# Patient Record
Sex: Male | Born: 1985 | Hispanic: No | Marital: Single | Smoking: Never smoker
Health system: Southern US, Community
[De-identification: ages and names within clinical notes are randomized; demographics above are authoritative.]

## PROBLEM LIST (undated history)

## (undated) DIAGNOSIS — N289 Disorder of kidney and ureter, unspecified: Secondary | ICD-10-CM

## (undated) HISTORY — PX: TESTICLE REMOVAL: SHX68

---
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
## 2004-05-22 ENCOUNTER — Ambulatory Visit (HOSPITAL_COMMUNITY): Admission: RE | Admit: 2004-05-22 | Discharge: 2004-05-22 | Payer: Self-pay | Admitting: Emergency Medicine

## 2017-02-17 ENCOUNTER — Inpatient Hospital Stay (HOSPITAL_COMMUNITY)
Admission: EM | Admit: 2017-02-17 | Discharge: 2017-02-20 | DRG: 872 | Disposition: A | Discharge status: Home / Self Care | Payer: Self-pay | Attending: Family Medicine | Admitting: Family Medicine

## 2017-02-17 ENCOUNTER — Encounter (HOSPITAL_COMMUNITY): Payer: Self-pay | Admitting: Emergency Medicine

## 2017-02-17 DIAGNOSIS — D72829 Elevated white blood cell count, unspecified: Secondary | ICD-10-CM | POA: Diagnosis present

## 2017-02-17 DIAGNOSIS — E872 Acidosis, unspecified: Secondary | ICD-10-CM | POA: Diagnosis present

## 2017-02-17 DIAGNOSIS — N390 Urinary tract infection, site not specified: Secondary | ICD-10-CM | POA: Diagnosis present

## 2017-02-17 DIAGNOSIS — D6489 Other specified anemias: Secondary | ICD-10-CM | POA: Diagnosis present

## 2017-02-17 DIAGNOSIS — R799 Abnormal finding of blood chemistry, unspecified: Secondary | ICD-10-CM | POA: Diagnosis present

## 2017-02-17 DIAGNOSIS — M6282 Rhabdomyolysis: Secondary | ICD-10-CM | POA: Diagnosis present

## 2017-02-17 DIAGNOSIS — A419 Sepsis, unspecified organism: Principal | ICD-10-CM | POA: Diagnosis present

## 2017-02-17 DIAGNOSIS — R778 Other specified abnormalities of plasma proteins: Secondary | ICD-10-CM | POA: Diagnosis present

## 2017-02-17 DIAGNOSIS — N3001 Acute cystitis with hematuria: Secondary | ICD-10-CM

## 2017-02-17 DIAGNOSIS — N200 Calculus of kidney: Secondary | ICD-10-CM | POA: Diagnosis present

## 2017-02-17 DIAGNOSIS — E860 Dehydration: Secondary | ICD-10-CM | POA: Diagnosis present

## 2017-02-17 DIAGNOSIS — N179 Acute kidney failure, unspecified: Secondary | ICD-10-CM | POA: Diagnosis present

## 2017-02-17 DIAGNOSIS — N3000 Acute cystitis without hematuria: Secondary | ICD-10-CM

## 2017-02-17 DIAGNOSIS — Z87442 Personal history of urinary calculi: Secondary | ICD-10-CM

## 2017-02-17 DIAGNOSIS — R7303 Prediabetes: Secondary | ICD-10-CM | POA: Diagnosis present

## 2017-02-17 DIAGNOSIS — R739 Hyperglycemia, unspecified: Secondary | ICD-10-CM | POA: Diagnosis present

## 2017-02-17 HISTORY — DX: Disorder of kidney and ureter, unspecified: N28.9

## 2017-02-17 LAB — URINALYSIS, ROUTINE W REFLEX MICROSCOPIC
Bilirubin Urine: NEGATIVE
GLUCOSE, UA: NEGATIVE mg/dL
KETONES UR: NEGATIVE mg/dL
Nitrite: NEGATIVE
PROTEIN: 100 mg/dL — AB
Specific Gravity, Urine: 1.015 (ref 1.005–1.030)
pH: 5 (ref 5.0–8.0)

## 2017-02-17 LAB — COMPREHENSIVE METABOLIC PANEL
ALT: 43 U/L (ref 17–63)
AST: 39 U/L (ref 15–41)
Albumin: 5.8 g/dL — ABNORMAL HIGH (ref 3.5–5.0)
Alkaline Phosphatase: 95 U/L (ref 38–126)
Anion gap: 19 — ABNORMAL HIGH (ref 5–15)
BUN: 20 mg/dL (ref 6–20)
CHLORIDE: 95 mmol/L — AB (ref 101–111)
CO2: 21 mmol/L — ABNORMAL LOW (ref 22–32)
CREATININE: 3.1 mg/dL — AB (ref 0.61–1.24)
Calcium: 11.2 mg/dL — ABNORMAL HIGH (ref 8.9–10.3)
GFR calc Af Amer: 29 mL/min — ABNORMAL LOW (ref >60)
GFR calc non Af Amer: 25 mL/min — ABNORMAL LOW (ref >60)
GLUCOSE: 219 mg/dL — AB (ref 65–99)
POTASSIUM: 3.9 mmol/L (ref 3.5–5.1)
SODIUM: 135 mmol/L (ref 135–145)
Total Bilirubin: 1.2 mg/dL (ref 0.3–1.2)
Total Protein: 10.6 g/dL — ABNORMAL HIGH (ref 6.5–8.1)

## 2017-02-17 LAB — LIPASE, BLOOD: LIPASE: 31 U/L (ref 11–51)

## 2017-02-17 LAB — CBC
HEMATOCRIT: 45.7 % (ref 39.0–52.0)
Hemoglobin: 15.8 g/dL (ref 13.0–17.0)
MCH: 28.8 pg (ref 26.0–34.0)
MCHC: 34.6 g/dL (ref 30.0–36.0)
MCV: 83.4 fL (ref 78.0–100.0)
PLATELETS: 422 10*3/uL — AB (ref 150–400)
RBC: 5.48 MIL/uL (ref 4.22–5.81)
RDW: 12.8 % (ref 11.5–15.5)
WBC: 16.9 10*3/uL — ABNORMAL HIGH (ref 4.0–10.5)

## 2017-02-17 LAB — CK: CK TOTAL: 211 U/L (ref 49–397)

## 2017-02-17 MED ORDER — OXYCODONE-ACETAMINOPHEN 5-325 MG PO TABS
ORAL_TABLET | ORAL | Status: AC
  Filled 2017-02-17: qty 1

## 2017-02-17 MED ORDER — SODIUM CHLORIDE 0.9 % IV BOLUS (SEPSIS)
1000.0000 mL | Freq: Once | INTRAVENOUS | Status: AC
  Administered 2017-02-18: 1000 mL via INTRAVENOUS

## 2017-02-17 MED ORDER — OXYCODONE-ACETAMINOPHEN 5-325 MG PO TABS
1.0000 | ORAL_TABLET | ORAL | Status: DC | PRN
  Administered 2017-02-17: 1 via ORAL

## 2017-02-17 NOTE — ED Triage Notes (Signed)
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 Family states pt vomited every time he tried to drink anything.

## 2017-02-18 ENCOUNTER — Emergency Department (HOSPITAL_COMMUNITY): Payer: Self-pay

## 2017-02-18 DIAGNOSIS — E872 Acidosis, unspecified: Secondary | ICD-10-CM | POA: Diagnosis present

## 2017-02-18 DIAGNOSIS — D72829 Elevated white blood cell count, unspecified: Secondary | ICD-10-CM | POA: Diagnosis present

## 2017-02-18 DIAGNOSIS — E860 Dehydration: Secondary | ICD-10-CM

## 2017-02-18 DIAGNOSIS — N179 Acute kidney failure, unspecified: Secondary | ICD-10-CM | POA: Diagnosis present

## 2017-02-18 DIAGNOSIS — R778 Other specified abnormalities of plasma proteins: Secondary | ICD-10-CM

## 2017-02-18 DIAGNOSIS — R739 Hyperglycemia, unspecified: Secondary | ICD-10-CM | POA: Diagnosis present

## 2017-02-18 DIAGNOSIS — N3000 Acute cystitis without hematuria: Secondary | ICD-10-CM

## 2017-02-18 DIAGNOSIS — N3001 Acute cystitis with hematuria: Secondary | ICD-10-CM

## 2017-02-18 DIAGNOSIS — N390 Urinary tract infection, site not specified: Secondary | ICD-10-CM | POA: Diagnosis present

## 2017-02-18 LAB — CBC
HCT: 35.4 % — ABNORMAL LOW (ref 39.0–52.0)
Hemoglobin: 12.4 g/dL — ABNORMAL LOW (ref 13.0–17.0)
MCH: 29.3 pg (ref 26.0–34.0)
MCHC: 35 g/dL (ref 30.0–36.0)
MCV: 83.7 fL (ref 78.0–100.0)
PLATELETS: 309 10*3/uL (ref 150–400)
RBC: 4.23 MIL/uL (ref 4.22–5.81)
RDW: 12.9 % (ref 11.5–15.5)
WBC: 10 10*3/uL (ref 4.0–10.5)

## 2017-02-18 LAB — BASIC METABOLIC PANEL
Anion gap: 8 (ref 5–15)
BUN: 22 mg/dL — AB (ref 6–20)
CALCIUM: 8.7 mg/dL — AB (ref 8.9–10.3)
CO2: 25 mmol/L (ref 22–32)
Chloride: 105 mmol/L (ref 101–111)
Creatinine, Ser: 1.85 mg/dL — ABNORMAL HIGH (ref 0.61–1.24)
GFR calc Af Amer: 54 mL/min — ABNORMAL LOW (ref >60)
GFR, EST NON AFRICAN AMERICAN: 46 mL/min — AB (ref >60)
Glucose, Bld: 121 mg/dL — ABNORMAL HIGH (ref 65–99)
POTASSIUM: 3.9 mmol/L (ref 3.5–5.1)
SODIUM: 138 mmol/L (ref 135–145)

## 2017-02-18 LAB — GLUCOSE, CAPILLARY
GLUCOSE-CAPILLARY: 107 mg/dL — AB (ref 65–99)
GLUCOSE-CAPILLARY: 112 mg/dL — AB (ref 65–99)
GLUCOSE-CAPILLARY: 116 mg/dL — AB (ref 65–99)
Glucose-Capillary: 85 mg/dL (ref 65–99)

## 2017-02-18 LAB — I-STAT CG4 LACTIC ACID, ED: Lactic Acid, Venous: 2.81 mmol/L (ref 0.5–1.9)

## 2017-02-18 LAB — MAGNESIUM: Magnesium: 2 mg/dL (ref 1.7–2.4)

## 2017-02-18 LAB — TSH: TSH: 0.529 u[IU]/mL (ref 0.350–4.500)

## 2017-02-18 MED ORDER — ACETAMINOPHEN 325 MG PO TABS: 650.0000 mg | ORAL_TABLET | Freq: Four times a day (QID) | ORAL | Status: DC | PRN

## 2017-02-18 MED ORDER — DEXTROSE 5 % IV SOLN
1.0000 g | Freq: Once | INTRAVENOUS | Status: AC
  Administered 2017-02-18: 1 g via INTRAVENOUS
  Filled 2017-02-18: qty 10

## 2017-02-18 MED ORDER — MORPHINE SULFATE (PF) 4 MG/ML IV SOLN
4.0000 mg | Freq: Once | INTRAVENOUS | Status: AC
  Administered 2017-02-18: 4 mg via INTRAVENOUS
  Filled 2017-02-18: qty 1

## 2017-02-18 MED ORDER — ONDANSETRON HCL 4 MG PO TABS: 4.0000 mg | ORAL_TABLET | Freq: Four times a day (QID) | ORAL | Status: DC | PRN

## 2017-02-18 MED ORDER — ONDANSETRON HCL 4 MG/2ML IJ SOLN
4.0000 mg | Freq: Once | INTRAMUSCULAR | Status: AC
  Administered 2017-02-18: 4 mg via INTRAVENOUS
  Filled 2017-02-18: qty 2

## 2017-02-18 MED ORDER — ACETAMINOPHEN 650 MG RE SUPP: 650.0000 mg | Freq: Four times a day (QID) | RECTAL | Status: DC | PRN

## 2017-02-18 MED ORDER — HEPARIN SODIUM (PORCINE) 5000 UNIT/ML IJ SOLN
5000.0000 [IU] | Freq: Three times a day (TID) | INTRAMUSCULAR | Status: DC
  Administered 2017-02-18 – 2017-02-20 (×7): 5000 [IU] via SUBCUTANEOUS
  Filled 2017-02-18 (×7): qty 1

## 2017-02-18 MED ORDER — INSULIN ASPART 100 UNIT/ML ~~LOC~~ SOLN: 0.0000 [IU] | Freq: Three times a day (TID) | SUBCUTANEOUS | Status: DC

## 2017-02-18 MED ORDER — INSULIN ASPART 100 UNIT/ML ~~LOC~~ SOLN: 0.0000 [IU] | Freq: Every day | SUBCUTANEOUS | Status: DC

## 2017-02-18 MED ORDER — DEXTROSE 5 % IV SOLN
1.0000 g | INTRAVENOUS | Status: DC
  Administered 2017-02-18: 1 g via INTRAVENOUS
  Filled 2017-02-18: qty 10

## 2017-02-18 MED ORDER — SODIUM CHLORIDE 0.9 % IV SOLN
INTRAVENOUS | Status: DC
  Administered 2017-02-18 – 2017-02-19 (×4): via INTRAVENOUS

## 2017-02-18 MED ORDER — ONDANSETRON HCL 4 MG/2ML IJ SOLN: 4.0000 mg | Freq: Four times a day (QID) | INTRAMUSCULAR | Status: DC | PRN

## 2017-02-18 NOTE — ED Provider Notes (Signed)
TIME SEEN: 12:06 AM  CHIEF COMPLAINT: diffuse cramping  HPI: Patient is a 31 year old male with history of previous kidney stones, right orchiectomy who presents emergency department with diffuse body cramps, nausea and vomiting. 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 He states that his brother is not normally drink much water. He states that he has not been able to keep any fluids down since getting home because of vomiting. No fevers, chest pain or shortness of breath, cough, diarrhea, bloody stool or melena, dysuria or hematuria.  No alcohol use. No drug use. No history of NSAID use. No previous history of kidney failure.  ROS: See HPI Constitutional: no fever  Eyes: no drainage  ENT: no runny nose   Cardiovascular:  no chest pain  Resp: no SOB  GI:  vomiting GU: no dysuria Integumentary: no rash  Allergy: no hives  Musculoskeletal: no leg swelling  Neurological: no slurred speech ROS otherwise negative  PAST MEDICAL HISTORY/PAST SURGICAL HISTORY:  Past Medical History:  Diagnosis Date  . Renal disorder    kidney stones    MEDICATIONS:  Prior to Admission medications   Not on File    ALLERGIES:  No Known Allergies  SOCIAL HISTORY:  Social History  Substance Use Topics  . Smoking status: Never Smoker  . Smokeless tobacco: Never Used  . Alcohol use Yes     Comment: occasional    FAMILY HISTORY: No family history on file.  EXAM: BP 118/87 (BP Location: Right Arm)   Pulse 95   Temp 98.3 F (36.8 C) (Oral)   Resp 18   Ht 5\' 5"  (1.651 m)   Wt 68 kg (150 lb)   SpO2 100%   BMI 24.96 kg/m  CONSTITUTIONAL: Alert and oriented and responds appropriately to questions. Afebrile. Appears uncomfortable.  Nontoxic. HEAD: Normocephalic EYES: Conjunctivae clear, pupils appear equal, EOMI ENT: normal nose; dry mucous membranes NECK: Supple, no meningismus, no nuchal rigidity, no LAD  CARD: RRR; S1 and S2  appreciated; no murmurs, no clicks, no rubs, no gallops RESP: Normal chest excursion without splinting or tachypnea; breath sounds clear and equal bilaterally; no wheezes, no rhonchi, no rales, no hypoxia or respiratory distress, speaking full sentences ABD/GI: Normal bowel sounds; non-distended; soft, mildly tender to palpation diffusely, no rebound, no guarding, no peritoneal signs, no hepatosplenomegaly BACK:  The back appears normal and is non-tender to palpation, there is no CVA tenderness EXT: Normal ROM in all joints; non-tender to palpation; no edema; normal capillary refill; no cyanosis, no calf tenderness or swelling; diffuse muscle spasms intermittently, compartments are all soft, extremities are warm and well perfused    SKIN: Normal color for age and race; warm; no rash NEURO: Moves all extremities equally, normal speech PSYCH: The patient's mood and manner are appropriate. Grooming and personal hygiene are appropriate.  MEDICAL DECISION MAKING: Patient here with diffuse cramping after being out on a hot room fall today. Labs obtained in triage show acute renal failure, leukocytosis and a urinary tract infection. His CK level is 211. Will obtain urine culture. We'll give Rocephin for UTI. Will obtain a noncontrast CT scan of his abdomen and pelvis. We'll give 2 L IV fluid bolus, morphine, Zofran for symptomatically relief. I feel he will need admission. Brother reports he does not have a history of renal failure. Mother reports he does not have a primary care provider.  ED PROGRESS: Patient's lactate is mildly  elevated which I think is likely to dehydration. Potassium, magnesium normal. Patient reports pain is gone after IV fluids and pain medication. We'll continue to hydrate patient. CT scan shows possible 7 x 10 mm calculus in the distal right ureter however there is no hydro-nephrosis present. He has not had any flank pain. I feel this is less likely to be a kidney stone. I do not feel  urology needs to be consulted emergently but can be contacted in the morning.  1:40 AM Discussed patient's case with hospitalist, Dr. Katrinka Blazing.  I have recommended admission and patient (and family if present) agree with this plan. Admitting physician will place admission orders. He agrees that urology can be consulted in the morning not emergently.  I reviewed all nursing notes, vitals, pertinent previous records, EKGs, lab and urine results, imaging (as available).    EKG Interpretation  Date/Time:  Tuesday February 18 2017 01:32:07 EDT Ventricular Rate:  77 PR Interval:    QRS Duration: 89 QT Interval:  393 QTC Calculation: 445 R Axis:   82 Text Interpretation:  Sinus rhythm ST elev, probable normal early repol pattern Confirmed by Ward, Baxter Hire [phone]) on 02/18/2017 1:40:21 AM          Ward, Layla Maw, DO 02/18/17 0145

## 2017-02-18 NOTE — Progress Notes (Addendum)
Subjective:  Patient admitted this morning, see detailed H&P by Dr Katrinka BlazingSmith.  31 y.o. male with medical history significant of nephrolithiasis and s/p right orchiectomy; who presents with complaints of diffuse cramps with nausea and vomiting starting yesterday morning. Patient's brother is present at bedside and serves as a Nurse, learning disabilitytranslator. 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 He does not normally drink a lot of water. He reported having generalized cramps all over including his stomach and had developed nausea and vomiting. Vomitus was described as nonbloody. He was taken lunch and had appropriate tone but was unable to keep down or any other liquids.  CT scan of the abdomen showed no hydronephrosis or nephrolithiasis. 7 x 10 mm calculus in RIGHT pelvis, suspected urolithiasis though, with typically cause hydronephrosis.   Vitals:   02/18/17 0651 02/18/17 1542  BP: (!) 102/56 (!) 109/54  Pulse: 86 76  Resp: 18 18  Temp: 98 F (36.7 C) 98 F (36.7 C)      A/P Calculus in the right pelvis, kidney UTI Acute kidney injury Dehydration  Continue IV ceftriaxone, follow urine culture results I called and discussed with the urologist Dr. Berneice HeinrichManny, who recommends outpatient follow-up  for ureteroscopy/stone removal. Renal function is improving with IV fluids Follow BMP in a.m.   Meredeth IdeGagan S Ramondo Dietze [facility] [phone]- [phone]

## 2017-02-18 NOTE — Progress Notes (Signed)
Pharmacy Antibiotic Note  Alex Craig is a 31 y.o. male admitted on 02/17/2017 with cramps, nausea and vomiting.  Pharmacy has been consulted for Ceftriaxone dosing for UTI.  Hx nephrolithiasis and s/p right orchiectomy.    Ceftriaxone 1gm IV given in ED ~1am today.   AKI improved with hydration.  Plan:  Continue Ceftriaxone 1gm IV q24hrs.  Will schedule for 10pm daily.  No adjustment needed for renal function.  Will follow up culture data.  Height: 5\' 5"  (165.1 cm) Weight: 161 lb 2.5 oz (73.1 kg) IBW/kg (Calculated) : 61.5  Temp (24hrs), Avg:98 F (36.7 C), Min:97.8 F (36.6 C), Max:98.3 F (36.8 C)   Recent Labs Lab 02/17/17 2052 02/18/17 0055 02/18/17 0357  WBC 16.9*  --  10.0  CREATININE 3.10*  --  1.85*  LATICACIDVEN  --  2.81*  --     Estimated Creatinine Clearance: 49.4 mL/min (A) (by C-G formula based on SCr of 1.85 mg/dL (H)).    No Known Allergies  Antimicrobials this admission:  Ceftriaxone 7/17>>  Dose adjustments this admission:  n/a  Microbiology results:  7/16 urine -  Thank you for allowing pharmacy to be a part of this patient's care.  Alex Craig, Tanisia Craig Alex Craig, Alex Craig Pager: [phone] 02/18/2017 6:27 PM

## 2017-02-18 NOTE — ED Notes (Signed)
Pt complains of cramping pain all over his body. Pt states it started this afternoon and has not stopped.

## 2017-02-18 NOTE — H&P (Signed)
History and Physical    ROSENDO COUSER ATF:[id number] DOB: [birth year] DOA: 02/17/2017  Referring MD/NP/PA: Dr. Cyril Mourning Ward PCP: Patient, No Pcp Per  Patient coming from: Home  Chief Complaint: Cramps with nausea and vomiting  HPI: Alex Craig is a 31 y.o. male with medical history significant of nephrolithiasis and s/p right orchiectomy; who presents with complaints of diffuse cramps with nausea and vomiting starting yesterday morning. Patient's brother is present at bedside and serves as a Optometrist. 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 He does not normally drink a lot of water. He reported having generalized cramps all over including his stomach and had developed nausea and vomiting. Vomitus was described as nonbloody. He was taken lunch and had appropriate tone but was unable to keep down or any other liquids. Associated symptoms included a complaints of chills and mild confusion. He does not regularly see primary care provider and is not currently on any medications. Denies having any cough, chest pain, weight loss, dysuria, penile discharge, diarrhea, or constipation symptoms.  ED Course: Upon admission into the emergency department patient was seen to be afebrile, pulse 75-95, and all other vital signs maintained. Labs revealed WBC 16.9, platelets 422, BUN 20, creatinine 3.1, calcium 11.2, glucose 219, and lactic acid 2.81. Urinalysis was positive for signs of infection. CT scan of the abdomen showed signs of urolithiasis without signs of hydronephrosis or obstruction.  Patient was given at least 2 L of normal saline IV fluids along with Alex Craig.  Review of Systems: Review of Systems  Constitutional: Positive for chills and malaise/fatigue.  HENT: Negative for ear discharge and nosebleeds.   Eyes: Negative for pain and discharge.  Respiratory: Negative for sputum production and shortness of breath.   Cardiovascular: Negative for  chest pain and palpitations.  Gastrointestinal: Positive for abdominal pain, nausea and vomiting. Negative for blood in stool and constipation.  Genitourinary: Negative for frequency, hematuria and urgency.  Musculoskeletal: Negative for falls.       Positive for Diffuse muscle cramps  Skin: Negative for itching and rash.  Neurological: Negative for sensory change, speech change and loss of consciousness.  Endo/Heme/Allergies: Negative for environmental allergies. Does not bruise/bleed easily.  Psychiatric/Behavioral: Negative for substance abuse and suicidal ideas.  All other systems reviewed and are negative.   Past Medical History:  Diagnosis Date  . Renal disorder    kidney stones    Past Surgical History:  Procedure Laterality Date  . TESTICLE REMOVAL       reports that he has never smoked. He has never used smokeless tobacco. He reports that he drinks alcohol. He reports that he does not use drugs.  No Known Allergies  No family history on file.  Prior to Admission medications   Not on File    Physical Exam:  Constitutional: Acutely sick-appearing male who is intermittently shivering Vitals:   02/17/17 2049 02/18/17 0045 02/18/17 0100 02/18/17 0115  BP:  124/87 118/85 110/81  Pulse:  80 79 75  Resp:    16  Temp:      TempSrc:      SpO2:  100% 99% 100%  Weight: 68 kg (150 lb)     Height: 5' 5"  (1.651 m)      Eyes: PERRL, lids and conjunctivae normal ENMT: Mucous membranes are dry. Posterior pharynx clear of any exudate or lesions.  Neck: normal, supple, no masses, no thyromegaly Respiratory: clear to auscultation bilaterally, no wheezing,  no crackles. Normal respiratory effort. No accessory muscle use.  Cardiovascular: Regular rate and rhythm, no murmurs / rubs / gallops. No extremity edema. 2+ pedal pulses. No carotid bruits.  Abdomen: no tenderness, no masses palpated. No hepatosplenomegaly. Bowel sounds positive.  Musculoskeletal: no clubbing / cyanosis.  No joint deformity upper and lower extremities. Good ROM, no contractures. Normal muscle tone.  Skin: no rashes, lesions, ulcers. No induration Neurologic: CN 2-12 grossly intact. Sensation intact, DTR normal. Strength 5/5 in all 4.  Psychiatric: Normal judgment and insight. Alert and oriented x 3. Normal mood.     Labs on Admission: I have personally reviewed following labs and imaging studies  CBC:  Recent Labs Lab 02/17/17 2052  WBC 16.9*  HGB 15.8  HCT 45.7  MCV 83.4  PLT 370*   Basic Metabolic Panel:  Recent Labs Lab 02/17/17 2052  NA 135  K 3.9  CL 95*  CO2 21*  GLUCOSE 219*  BUN 20  CREATININE 3.10*  CALCIUM 11.2*  MG 2.0   GFR: Estimated Creatinine Clearance: 29.5 mL/min (A) (by C-G formula based on SCr of 3.1 mg/dL (H)). Liver Function Tests:  Recent Labs Lab 02/17/17 2052  AST 39  ALT 43  ALKPHOS 95  BILITOT 1.2  PROT 10.6*  ALBUMIN 5.8*    Recent Labs Lab 02/17/17 2052  LIPASE 31   No results for input(s): AMMONIA in the last 168 hours. Coagulation Profile: No results for input(s): INR, PROTIME in the last 168 hours. Cardiac Enzymes:  Recent Labs Lab 02/17/17 2052  CKTOTAL 211   BNP (last 3 results) No results for input(s): PROBNP in the last 8760 hours. HbA1C: No results for input(s): HGBA1C in the last 72 hours. CBG: No results for input(s): GLUCAP in the last 168 hours. Lipid Profile: No results for input(s): CHOL, HDL, LDLCALC, TRIG, CHOLHDL, LDLDIRECT in the last 72 hours. Thyroid Function Tests: No results for input(s): TSH, T4TOTAL, FREET4, T3FREE, THYROIDAB in the last 72 hours. Anemia Panel: No results for input(s): VITAMINB12, FOLATE, FERRITIN, TIBC, IRON, RETICCTPCT in the last 72 hours. Urine analysis:    Component Value Date/Time   COLORURINE AMBER (A) 02/17/2017 2041   APPEARANCEUR CLOUDY (A) 02/17/2017 2041   LABSPEC 1.015 02/17/2017 2041   PHURINE 5.0 02/17/2017 2041   GLUCOSEU NEGATIVE 02/17/2017 2041    HGBUR LARGE (A) 02/17/2017 2041   BILIRUBINUR NEGATIVE 02/17/2017 2041   [place] 02/17/2017 2041   PROTEINUR 100 (A) 02/17/2017 2041   NITRITE NEGATIVE 02/17/2017 2041   LEUKOCYTESUR MODERATE (A) 02/17/2017 2041   Sepsis Labs: No results found for this or any previous visit (from the past 240 hour(s)).   Radiological Exams on Admission: Ct Abdomen Pelvis Wo Contrast  Result Date: 02/18/2017 CLINICAL DATA:  Acute renal failure, abdominal pain. Urinary tract infection. History of kidney stones, orchectomy. EXAM: CT ABDOMEN AND PELVIS WITHOUT CONTRAST TECHNIQUE: Multidetector CT imaging of the abdomen and pelvis was performed following the standard protocol without IV contrast. COMPARISON:  CT abdomen and pelvis May 22, 2004 FINDINGS: LOWER CHEST: Lung bases are clear. The visualized heart size is normal. No pericardial effusion. HEPATOBILIARY: Normal. PANCREAS: Normal. SPLEEN: Normal. ADRENALS/URINARY TRACT: Kidneys are orthotopic, demonstrating normal size and morphology. No nephrolithiasis, hydronephrosis; limited assessment for renal masses on this nonenhanced examination. The unopacified ureters are normal in course and caliber. 10 x 7 mm calcification RIGHT lower quadrant along the course of the distal ureter. Urinary bladder is partially distended and unremarkable. Normal adrenal glands. STOMACH/BOWEL: The  stomach, small and large bowel are normal in course and caliber without inflammatory changes, sensitivity decreased by lack of enteric contrast. Normal appendix. VASCULAR/LYMPHATIC: Aortoiliac vessels are normal in course and caliber trace calcific atherosclerosis. No lymphadenopathy by CT size criteria. REPRODUCTIVE: Included scrotum demonstrates solitary LEFT testicle. Prostate is not enlarged. OTHER: No intraperitoneal free fluid or free air. MUSCULOSKELETAL: Non-acute. IMPRESSION: 1. No hydronephrosis or nephrolithiasis. 7 x 10 mm calculus in RIGHT pelvis, suspected urolithiasis  though, with typically cause hydronephrosis. Aortic Atherosclerosis (ICD10-I70.0). Electronically Signed   By: Elon Alas M.D.   On: 02/18/2017 01:04    EKG: Independently reviewed. Sinus rhythm with signs of early repolarization  Assessment/Plan Acute renal failure 2/2 dehydration from nausea vomiting symptoms. No acute obstruction noted on CT scan of the abdomen. - Admit to med-surg - IV fluids NS at 152m/hr as tolerated - Monitor ins and outs - Recheck BMP in a.m.  UTI with urolitiasis: Acute. Patient presents with signs of acute urinary tract infection. - Follow-up urine culture - Alex per pharmacy - may warrant urology consult in a.m  Nausea and vomiting - Zofran prn nausea vomiting   Lactic acidosis and leukocytosis: Patient does not appear to met SIRS criteria suspect lactic acidosis secondary to dehydration.  - Trend lactic acid level - Recheck CBC in a.m  Hypercalcemia: Acute. Patient's initial calcium level elevated at 11.2. - IV fluid hydration - may warrant further investigation  Metabolic acidosis with elevated anion gap: Initial sodium bicarbonate 21 with anion gap of 19 on admission. - Continue to monitor  Elevated protein: Patient with initial albumin of 5.8 and total protein of 10.6.  - Check Spep  Hyperglycemia: Acute. Initial glucose elevated to 219 - Check hemoglobin A1c - Hypoglycemic protocols - CBGs every before meals and at bedtime with sensitive sliding scale insulin  DVT prophylaxis: heparin Code Status: Full  Family Communication:  discuss plan of care with the patient and family present at bedside Disposition: Likely discharge home once medically stable Consults called: None  Admission status: Inpatient   RNorval MortonMD [facility] Pager [phone] If 7PM-7AM, please contact night-coverage [email or web address] Password [email or web address]  02/18/2017, 1:36 AM

## 2017-02-18 NOTE — Progress Notes (Signed)
Interpreter Graciela Namihira for patient °

## 2017-02-19 DIAGNOSIS — N179 Acute kidney failure, unspecified: Secondary | ICD-10-CM

## 2017-02-19 LAB — CBC
HEMATOCRIT: 35.4 % — AB (ref 39.0–52.0)
HEMOGLOBIN: 12 g/dL — AB (ref 13.0–17.0)
MCH: 29.4 pg (ref 26.0–34.0)
MCHC: 33.9 g/dL (ref 30.0–36.0)
MCV: 86.8 fL (ref 78.0–100.0)
Platelets: 281 10*3/uL (ref 150–400)
RBC: 4.08 MIL/uL — ABNORMAL LOW (ref 4.22–5.81)
RDW: 13.5 % (ref 11.5–15.5)
WBC: 6.1 10*3/uL (ref 4.0–10.5)

## 2017-02-19 LAB — BASIC METABOLIC PANEL
ANION GAP: 6 (ref 5–15)
BUN: 17 mg/dL (ref 6–20)
CALCIUM: 8.8 mg/dL — AB (ref 8.9–10.3)
CO2: 24 mmol/L (ref 22–32)
Chloride: 108 mmol/L (ref 101–111)
Creatinine, Ser: 0.92 mg/dL (ref 0.61–1.24)
Glucose, Bld: 93 mg/dL (ref 65–99)
POTASSIUM: 4 mmol/L (ref 3.5–5.1)
Sodium: 138 mmol/L (ref 135–145)

## 2017-02-19 LAB — HEMOGLOBIN A1C
HEMOGLOBIN A1C: 5.6 % (ref 4.8–5.6)
MEAN PLASMA GLUCOSE: 114 mg/dL

## 2017-02-19 LAB — HIV ANTIBODY (ROUTINE TESTING W REFLEX): HIV Screen 4th Generation wRfx: NONREACTIVE

## 2017-02-19 LAB — GLUCOSE, CAPILLARY
GLUCOSE-CAPILLARY: 96 mg/dL (ref 65–99)
GLUCOSE-CAPILLARY: 99 mg/dL (ref 65–99)
GLUCOSE-CAPILLARY: 94 mg/dL (ref 65–99)
Glucose-Capillary: 98 mg/dL (ref 65–99)

## 2017-02-19 LAB — URINE CULTURE

## 2017-02-19 MED ORDER — NITROFURANTOIN MACROCRYSTAL 100 MG PO CAPS
100.0000 mg | ORAL_CAPSULE | Freq: Two times a day (BID) | ORAL | Status: DC
  Administered 2017-02-19 – 2017-02-20 (×2): 100 mg via ORAL
  Filled 2017-02-19 (×2): qty 1

## 2017-02-19 NOTE — Care Management Note (Signed)
Case Management Note  Patient Details  Name: Alex Craig MRN: [id number] Date of Birth: 1986/02/08  Subjective/Objective:     Pt admitted on 02/17/17 with ARF from dehydration, UTI, N/V, and hypercalcemia.  PTA, pt independent, lives with mother.               Action/Plan: Pt states mother can provide assist at dc.  Will follow for discharge needs as pt progresses.    Expected Discharge Date:                  Expected Discharge Plan:  Home/Self Care  In-House Referral:     Discharge planning Services  CM Consult  Post Acute Care Choice:    Choice offered to:     DME Arranged:    DME Agency:     HH Arranged:    HH Agency:     Status of Service:  In process, will continue to follow  If discussed at Long Length of Stay Meetings, dates discussed:    Additional Comments:  Quintella BatonJulie W. Kamyla Olejnik, RN, BSN  Trauma/Neuro ICU Case Manager [phone]

## 2017-02-19 NOTE — Progress Notes (Signed)
PROGRESS NOTE    Alex Craig  ZOX:[id number] DOB: [birth year] DOA: 02/17/2017 PCP: Patient, No Pcp Per  Outpatient Specialists:     Brief Narrative:  31 year old Hispanic male Admitted 7/17 with generalized cramps nausea vomiting nonbloody Came to emergency room because of cramps and nausea Found to have lactic acid 2.8 BUN/creatinine 20/3.1 CT scan showing urolithiasis without obstruction and started on Rocephin and IV fluids   Assessment & Plan:   Principal Problem:   ARF (acute renal failure) (HCC) Active Problems:   UTI (urinary tract infection)   Elevated total protein   Hypercalcemia   Lactic acidosis   Leukocytosis   Hyperglycemia   Probable mild rhabdo + acute kidney injury Metabolic acidosis on admission now resolved  Creatinine much better and resolving  Discontinue IV saline 1 25 cc/h and force fluids  Repeat labs a.m.  We will need counseling regarding further management as an outpatient see below  Urolithiasis Possible early sepsis white count 16.9  Has a small kidney stone which is nonobstructive and not causing hydronephrosis  Outpatient follow-up with Dr. Urban Gibson  Will need by mouth Macrodantin at least for 7 days as an outpatient given nidus of infection present  Urine culture shows only less than 10,000 colony-forming units however would continue antibiotics for now  Prediabetes HbA1c 5.6  We'll need counseling and addressing as an out agent   dilutional anemia  Stop IV fluids. Outpatient monitoring   [facility] Inpatient MedSurg Likely discharge home a.m.  Consultants:   None  Procedures:   CT scan  Antimicrobials:   Rocephin 7/17-7/18  Macrodantin 7/18 >>    Subjective: Well pleasant eating drinking feels better No nausea no vomiting No abdominal pain No chest pain   Objective: Vitals:   02/18/17 0651 02/18/17 1542 02/18/17 2306 02/19/17 0632  BP: (!) 102/56 (!) 109/54 106/70 104/62  Pulse: 86 76 65 (!) 57  Resp: 18 18  18 18   Temp: 98 F (36.7 C) 98 F (36.7 C) 97.8 F (36.6 C) 98.1 F (36.7 C)  TempSrc: Oral Oral Oral Oral  SpO2: 100% 100% 100% 100%  Weight:      Height:        Intake/Output Summary (Last 24 hours) at 02/19/17 1657 Last data filed at 02/19/17 1300  Gross per 24 hour  Intake             2590 ml  Output             1675 ml  Net              915 ml   Filed Weights   02/17/17 2049 02/18/17 0253  Weight: 68 kg (150 lb) 73.1 kg (161 lb 2.5 oz)    Examination:  General exam: Appears calm and comfortable  Respiratory system: Clear to auscultation. Respiratory effort normal. Cardiovascular system: S1 & S2 heard, RRR. No JVD, murmurs, rubs, gallops or clicks. No pedal edema. Gastrointestinal system: Abdomen is nondistended, soft and nontender. No organomegaly or masses felt. Normal bowel sounds heard. Central nervous system: Alert and oriented. No focal neurological deficits. Extremities: Symmetric 5 x 5 power. Skin: No rashes, lesions or ulcers Psychiatry: Judgement and insight appear normal. Mood & affect appropriate.     Data Reviewed: I have personally reviewed following labs and imaging studies  CBC:  Recent Labs Lab 02/17/17 2052 02/18/17 0357 02/19/17 0436  WBC 16.9* 10.0 6.1  HGB 15.8 12.4* 12.0*  HCT 45.7 35.4* 35.4*  MCV 83.4 83.7 86.8  PLT 422* 309 281   Basic Metabolic Panel:  Recent Labs Lab 02/17/17 2052 02/18/17 0357 02/19/17 0436  NA 135 138 138  K 3.9 3.9 4.0  CL 95* 105 108  CO2 21* 25 24  GLUCOSE 219* 121* 93  BUN 20 22* 17  CREATININE 3.10* 1.85* 0.92  CALCIUM 11.2* 8.7* 8.8*  MG 2.0  --   --    GFR: Estimated Creatinine Clearance: 99.3 mL/min (by C-G formula based on SCr of 0.92 mg/dL). Liver Function Tests:  Recent Labs Lab 02/17/17 2052  AST 39  ALT 43  ALKPHOS 95  BILITOT 1.2  PROT 10.6*  ALBUMIN 5.8*    Recent Labs Lab 02/17/17 2052  LIPASE 31   No results for input(s): AMMONIA in the last 168  hours. Coagulation Profile: No results for input(s): INR, PROTIME in the last 168 hours. Cardiac Enzymes:  Recent Labs Lab 02/17/17 2052  CKTOTAL 211   BNP (last 3 results) No results for input(s): PROBNP in the last 8760 hours. HbA1C:  Recent Labs  02/18/17 0357  HGBA1C 5.6   CBG:  Recent Labs Lab 02/18/17 1210 02/18/17 1732 02/18/17 2143 02/19/17 0745 02/19/17 1137  GLUCAP 85 112* 116* 98 96   Lipid Profile: No results for input(s): CHOL, HDL, LDLCALC, TRIG, CHOLHDL, LDLDIRECT in the last 72 hours. Thyroid Function Tests:  Recent Labs  02/18/17 0357  TSH 0.529   Anemia Panel: No results for input(s): VITAMINB12, FOLATE, FERRITIN, TIBC, IRON, RETICCTPCT in the last 72 hours. Urine analysis:    Component Value Date/Time   COLORURINE AMBER (A) 02/17/2017 2041   APPEARANCEUR CLOUDY (A) 02/17/2017 2041   LABSPEC 1.015 02/17/2017 2041   PHURINE 5.0 02/17/2017 2041   GLUCOSEU NEGATIVE 02/17/2017 2041   HGBUR LARGE (A) 02/17/2017 2041   BILIRUBINUR NEGATIVE 02/17/2017 2041   KETONESUR NEGATIVE 02/17/2017 2041   PROTEINUR 100 (A) 02/17/2017 2041   NITRITE NEGATIVE 02/17/2017 2041   LEUKOCYTESUR MODERATE (A) 02/17/2017 2041   Sepsis Labs: @LABRCNTIP (procalcitonin:4,lacticidven:4)  ) Recent Results (from the past 240 hour(s))  Urine culture     Status: Abnormal   Collection Time: 02/17/17  8:41 PM  Result Value Ref Range Status   Specimen Description URINE, RANDOM  Final   Special Requests ADDED 0042 02/18/17  Final   Culture <10,000 COLONIES/mL INSIGNIFICANT GROWTH (A)  Final   Report Status 02/19/2017 FINAL  Final         Radiology Studies: Ct Abdomen Pelvis Wo Contrast  Result Date: 02/18/2017 CLINICAL DATA:  Acute renal failure, abdominal pain. Urinary tract infection. History of kidney stones, orchectomy. EXAM: CT ABDOMEN AND PELVIS WITHOUT CONTRAST TECHNIQUE: Multidetector CT imaging of the abdomen and pelvis was performed following the  standard protocol without IV contrast. COMPARISON:  CT abdomen and pelvis May 22, 2004 FINDINGS: LOWER CHEST: Lung bases are clear. The visualized heart size is normal. No pericardial effusion. HEPATOBILIARY: Normal. PANCREAS: Normal. SPLEEN: Normal. ADRENALS/URINARY TRACT: Kidneys are orthotopic, demonstrating normal size and morphology. No nephrolithiasis, hydronephrosis; limited assessment for renal masses on this nonenhanced examination. The unopacified ureters are normal in course and caliber. 10 x 7 mm calcification RIGHT lower quadrant along the course of the distal ureter. Urinary bladder is partially distended and unremarkable. Normal adrenal glands. STOMACH/BOWEL: The stomach, small and large bowel are normal in course and caliber without inflammatory changes, sensitivity decreased by lack of enteric contrast. Normal appendix. VASCULAR/LYMPHATIC: Aortoiliac vessels are normal in course and caliber trace calcific atherosclerosis. No lymphadenopathy by CT  size criteria. REPRODUCTIVE: Included scrotum demonstrates solitary LEFT testicle. Prostate is not enlarged. OTHER: No intraperitoneal free fluid or free air. MUSCULOSKELETAL: Non-acute. IMPRESSION: 1. No hydronephrosis or nephrolithiasis. 7 x 10 mm calculus in RIGHT pelvis, suspected urolithiasis though, with typically cause hydronephrosis. Aortic Atherosclerosis (ICD10-I70.0). Electronically Signed   By: Awilda Metroourtnay  Bloomer M.D.   On: 02/18/2017 01:04        Scheduled Meds: . heparin  5,000 Units Subcutaneous Q8H  . insulin aspart  0-5 Units Subcutaneous QHS  . insulin aspart  0-9 Units Subcutaneous TID WC  . nitrofurantoin  100 mg Oral Q12H   Continuous Infusions:   LOS: 1 day    Time spent: 325    Pleas KochJai Everlie Eble, MD [facility] (P) [phone]   If 7PM-7AM, please contact night-coverage [email or web address] Password [email or web address] 02/19/2017, 4:57 PM

## 2017-02-20 LAB — BASIC METABOLIC PANEL
ANION GAP: 7 (ref 5–15)
BUN: 12 mg/dL (ref 6–20)
CO2: 28 mmol/L (ref 22–32)
Calcium: 9.6 mg/dL (ref 8.9–10.3)
Chloride: 104 mmol/L (ref 101–111)
Creatinine, Ser: 0.97 mg/dL (ref 0.61–1.24)
GFR calc Af Amer: >60 mL/min (ref >60)
Glucose, Bld: 93 mg/dL (ref 65–99)
POTASSIUM: 3.7 mmol/L (ref 3.5–5.1)
SODIUM: 139 mmol/L (ref 135–145)

## 2017-02-20 LAB — GLUCOSE, CAPILLARY: GLUCOSE-CAPILLARY: 97 mg/dL (ref 65–99)

## 2017-02-20 MED ORDER — NITROFURANTOIN MACROCRYSTAL 100 MG PO CAPS: 100.0000 mg | ORAL_CAPSULE | Freq: Two times a day (BID) | ORAL | 0 refills | Status: AC

## 2017-02-20 NOTE — Discharge Summary (Signed)
Physician Discharge Summary  Lorenso Quarryntonio P Brotherton WUJ:[id number] DOB: 12/18/1985 DOA: 02/17/2017  PCP: Alex Craig, No Pcp Per  Admit date: 02/17/2017 Discharge date: 02/20/2017  Time spent: 25 minutes  Recommendations for Outpatient Follow-up:  1. Needs outpatient follow-up with Dr. Berneice HeinrichManny urologist who recommends follow-up 2. Meds-Macrodantin 3. Will need outpatient diabetic counseling-has no PCP so referred by case management [facility]  Discharge Diagnoses:  Principal Problem:   ARF (acute renal failure) (HCC) Active Problems:   UTI (urinary tract infection)   Elevated total protein   Hypercalcemia   Lactic acidosis   Leukocytosis   Hyperglycemia   Discharge Condition: Improved  Diet recommendation: Regular  Filed Weights   02/17/17 2049 02/18/17 0253  Weight: 68 kg (150 lb) 73.1 kg (161 lb 2.5 oz)    History of present illness:  31 year old Hispanic male Admitted 7/17 with generalized cramps nausea vomiting nonbloody Came to emergency room because of cramps and nausea Found to have lactic acid 2.8 BUN/creatinine 20/3.1 CT scan showing urolithiasis without obstruction and started on Rocephin and IV fluids    Hospital Course:   Probable mild rhabdo + acute kidney injury Metabolic acidosis on admission now resolved             Creatinine much better and resolving             Discontinue IV saline 1 25 cc/h and force fluids              creatinine on discharge was back to normal  Urolithiasis Possible early sepsis white count 16.9             Has a small kidney stone which is nonobstructive and not causing hydronephrosis             Outpatient follow-up with Dr. Urban GibsonMani             Will need by mouth Macrodantin and have prescribed the same             Urine culture shows only less than 10,000 colony-forming units however would continue antibiotics for now  Prediabetes HbA1c 5.6             We'll need counseling and addressing as an  out agent   I had a chat with him with an interpreter in detail about changing his diet from junk food on his job to more traditional food with more vegetables   Discharge Exam: Vitals:   02/19/17 2050 02/20/17 0600  BP: 109/64 119/67  Pulse: (!) 58 (!) 58  Resp: 18 18  Temp: 98.3 F (36.8 C) 98.4 F (36.9 C)    Alert pleasant oriented no distress EOMI Anicteric no pallor Chest clear Abdomen soft No CVA tenderness No lower extremity edema  Discharge Instructions   Discharge Instructions    Consult to care management    Complete by:  As directed    Needs OP [facility] appt in 1-2 weeks   Diet - low sodium heart healthy    Complete by:  As directed    Discharge instructions    Complete by:  As directed    Finish antibiotics Go see Urologist Dr. Berneice HeinrichManny 1 month You need to go see a regular Dr. --we will arrange a follow up   Increase activity slowly    Complete by:  As directed      Current Discharge Medication List    START taking these medications   Details  nitrofurantoin (  MACRODANTIN) 100 MG capsule Take 1 capsule (100 mg total) by mouth every 12 (twelve) hours. Qty: 12 capsule, Refills: 0       No Known Allergies Follow-up Information    Sebastian Ache, MD. Schedule an appointment as soon as possible for a visit in 1 month(s).   Specialties:  Anesthesiology, Cardiology Contact information: [address] [place] [address] [phone]        Sebastian Ache, MD .   Specialty:  Urology Contact information: [address] [place] [address] [phone]            The results of significant diagnostics from this hospitalization (including imaging, microbiology, ancillary and laboratory) are listed below for reference.    Significant Diagnostic Studies: Ct Abdomen Pelvis Wo Contrast  Result Date: 02/18/2017 CLINICAL DATA:  Acute renal failure, abdominal pain. Urinary tract infection. History of kidney  stones, orchectomy. EXAM: CT ABDOMEN AND PELVIS WITHOUT CONTRAST TECHNIQUE: Multidetector CT imaging of the abdomen and pelvis was performed following the standard protocol without IV contrast. COMPARISON:  CT abdomen and pelvis May 22, 2004 FINDINGS: LOWER CHEST: Lung bases are clear. The visualized heart size is normal. No pericardial effusion. HEPATOBILIARY: Normal. PANCREAS: Normal. SPLEEN: Normal. ADRENALS/URINARY TRACT: Kidneys are orthotopic, demonstrating normal size and morphology. No nephrolithiasis, hydronephrosis; limited assessment for renal masses on this nonenhanced examination. The unopacified ureters are normal in course and caliber. 10 x 7 mm calcification RIGHT lower quadrant along the course of the distal ureter. Urinary bladder is partially distended and unremarkable. Normal adrenal glands. STOMACH/BOWEL: The stomach, small and large bowel are normal in course and caliber without inflammatory changes, sensitivity decreased by lack of enteric contrast. Normal appendix. VASCULAR/LYMPHATIC: Aortoiliac vessels are normal in course and caliber trace calcific atherosclerosis. No lymphadenopathy by CT size criteria. REPRODUCTIVE: Included scrotum demonstrates solitary LEFT testicle. Prostate is not enlarged. OTHER: No intraperitoneal free fluid or free air. MUSCULOSKELETAL: Non-acute. IMPRESSION: 1. No hydronephrosis or nephrolithiasis. 7 x 10 mm calculus in RIGHT pelvis, suspected urolithiasis though, with typically cause hydronephrosis. Aortic Atherosclerosis (ICD10-I70.0). Electronically Signed   By: Awilda Metro M.D.   On: 02/18/2017 01:04    Microbiology: Recent Results (from the past 240 hour(s))  Urine culture     Status: Abnormal   Collection Time: 02/17/17  8:41 PM  Result Value Ref Range Status   Specimen Description URINE, RANDOM  Final   Special Requests ADDED 0042 02/18/17  Final   Culture <10,000 COLONIES/mL INSIGNIFICANT GROWTH (A)  Final   Report Status 02/19/2017  FINAL  Final     Labs: Basic Metabolic Panel:  Recent Labs Lab 02/17/17 2052 02/18/17 0357 02/19/17 0436 02/20/17 0411  NA 135 138 138 139  K 3.9 3.9 4.0 3.7  CL 95* 105 108 104  CO2 21* 25 24 28   GLUCOSE 219* 121* 93 93  BUN 20 22* 17 12  CREATININE 3.10* 1.85* 0.92 0.97  CALCIUM 11.2* 8.7* 8.8* 9.6  MG 2.0  --   --   --    Liver Function Tests:  Recent Labs Lab 02/17/17 2052  AST 39  ALT 43  ALKPHOS 95  BILITOT 1.2  PROT 10.6*  ALBUMIN 5.8*    Recent Labs Lab 02/17/17 2052  LIPASE 31   No results for input(s): AMMONIA in the last 168 hours. CBC:  Recent Labs Lab 02/17/17 2052 02/18/17 0357 02/19/17 0436  WBC 16.9* 10.0 6.1  HGB 15.8 12.4* 12.0*  HCT 45.7 35.4* 35.4*  MCV 83.4 83.7 86.8  PLT 422* 309 281   Cardiac Enzymes:  Recent Labs Lab 02/17/17 2052  CKTOTAL 211   BNP: BNP (last 3 results) No results for input(s): BNP in the last 8760 hours.  ProBNP (last 3 results) No results for input(s): PROBNP in the last 8760 hours.  CBG:  Recent Labs Lab 02/19/17 0745 02/19/17 1137 02/19/17 1711 02/19/17 2138 02/20/17 0758  GLUCAP 98 96 99 94 97       Signed:  Rhetta Mura MD   [facility] 02/20/2017, 11:08 AM

## 2017-02-20 NOTE — Progress Notes (Signed)
[name] for Dr Mahala MenghiniSamtani

## 2017-02-20 NOTE — Progress Notes (Signed)
Patient had an uneventful night and was sleeping during all rounds. No pain or nausea reported this morning. No sign of distress noted. Will continue to monitor.

## 2017-02-21 LAB — PROTEIN ELECTROPHORESIS, SERUM
A/G RATIO SPE: 1 (ref 0.7–1.7)
Albumin ELP: 3.3 g/dL (ref 2.9–4.4)
Alpha-1-Globulin: 0.2 g/dL (ref 0.0–0.4)
Alpha-2-Globulin: 0.5 g/dL (ref 0.4–1.0)
BETA GLOBULIN: 1.2 g/dL (ref 0.7–1.3)
GAMMA GLOBULIN: 1.4 g/dL (ref 0.4–1.8)
Globulin, Total: 3.2 g/dL (ref 2.2–3.9)
Total Protein ELP: 6.5 g/dL (ref 6.0–8.5)

## 2017-02-26 ENCOUNTER — Inpatient Hospital Stay: Payer: Self-pay

## 2017-02-26 NOTE — Progress Notes (Deleted)
Patient ID: Alex Craig, male   DOB: [birth year], 31 y.o.   MRN: [id number] after being hospitalized 02/17/2017-02/20/2017 for ARF and UTI with probable early sepsis.  She was treated with Rocephin and IVF and discharged on Macrodantin.  She was also found to have nephrolithiasis without obstruction, hyperglycemia, hypercalcemia, lactic acidosis, leukocytosis, and elevated total protein.  She is supposed to f/up with Dr Manny(urology) in 1 month.

## 2018-07-22 IMAGING — CT CT ABD-PELV W/O CM
2 of 4 series · 15 of 46 positions shown, 17 images · non-contrast
Comparison: CT abdomen and pelvis May 22, 2004

CLINICAL DATA: Acute renal failure, abdominal pain. Urinary tract
infection. History of kidney stones, orchectomy.

EXAM:
CT ABDOMEN AND PELVIS WITHOUT CONTRAST
TECHNIQUE: Multidetector CT imaging of the abdomen and pelvis was performed
following the standard protocol without IV contrast.

[Series 3: a/p w/o 5mm · axial · non-contrast · 0.74mm/px · z∈[-199,+196]mm · 12 of 91 slices shown, 14 images]
[im 8/91  soft-tissue]
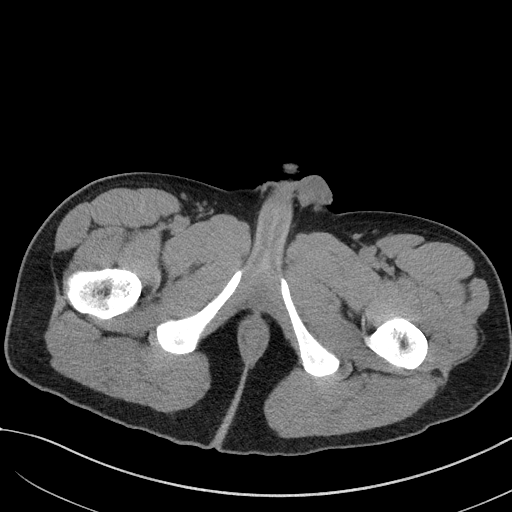
[im 8/91  bone]
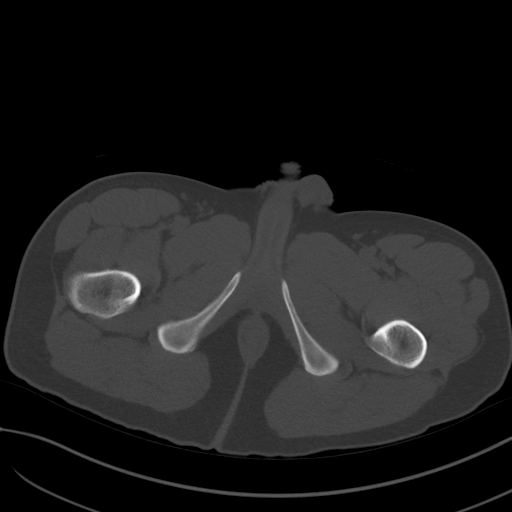
[im 15/91  soft-tissue]
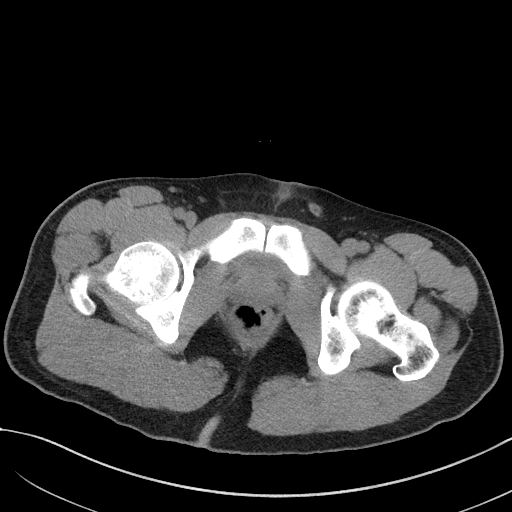
[im 22/91  soft-tissue]
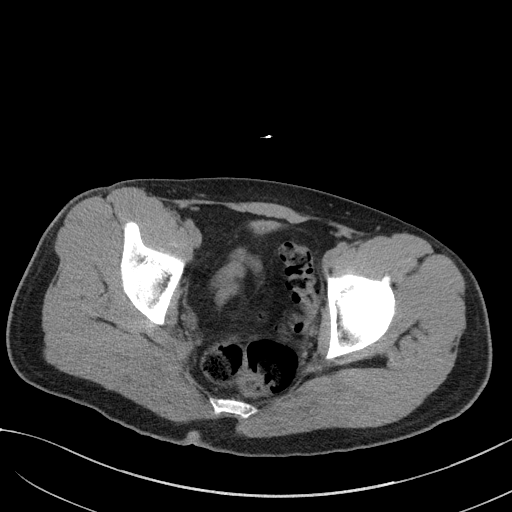
[im 29/91  soft-tissue]
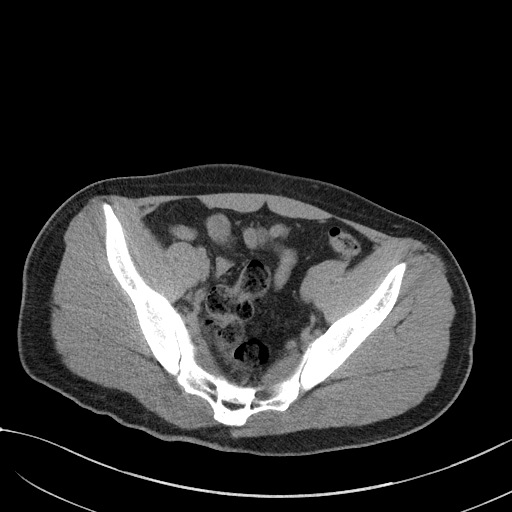
[im 37/91  soft-tissue]
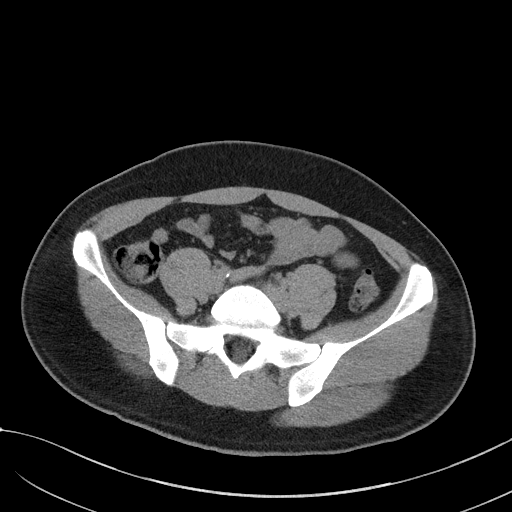
[im 44/91  soft-tissue]
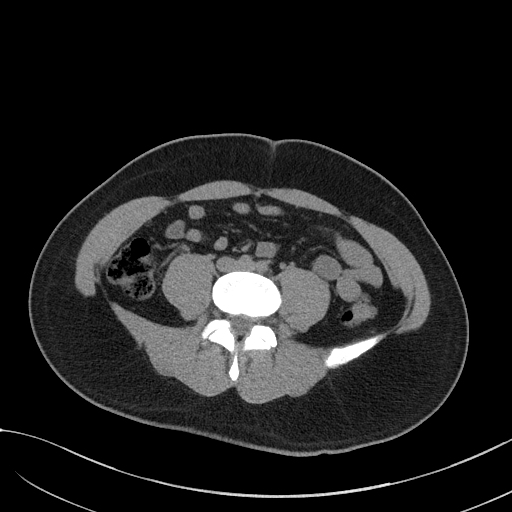
[im 51/91  soft-tissue]
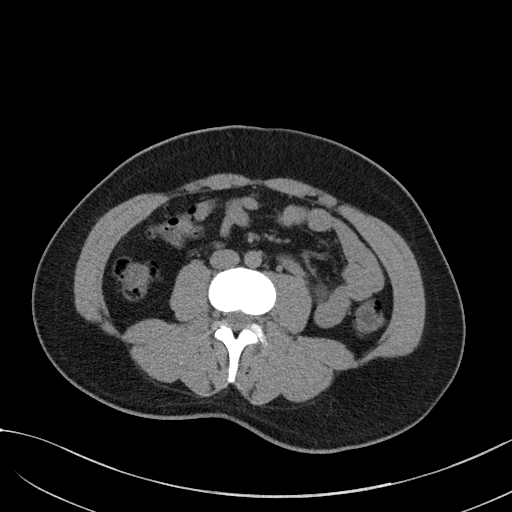
[im 58/91  soft-tissue]
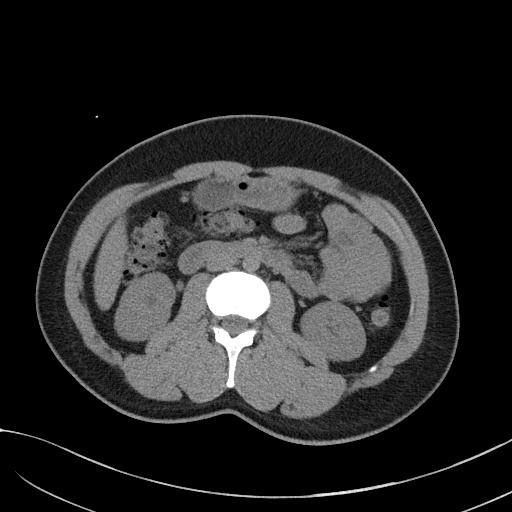
[im 65/91  soft-tissue]
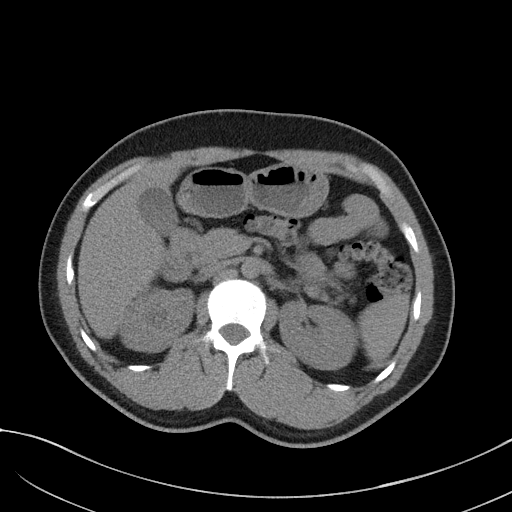
[im 65/91  bone]
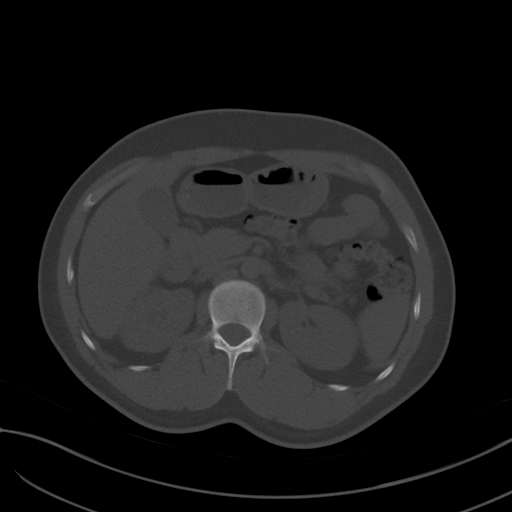
[im 73/91  soft-tissue]
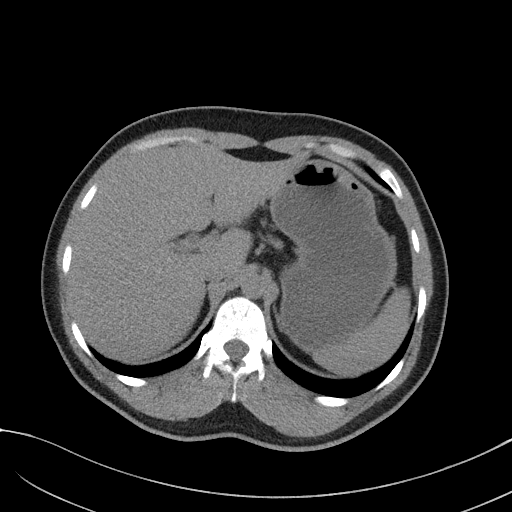
[im 80/91  soft-tissue]
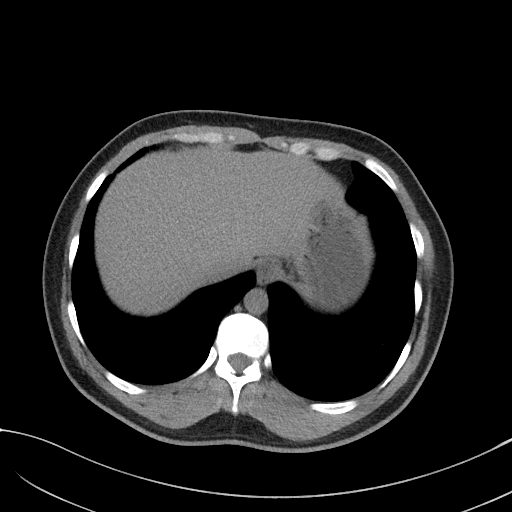
[im 87/91  soft-tissue]
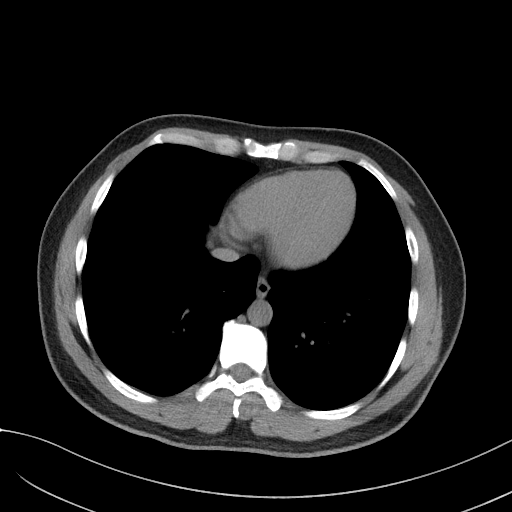

[Series 6: a/p w/o cor · coronal · non-contrast · 0.88mm/px · 3 of 113 slices shown]
[im 38/113  soft-tissue]
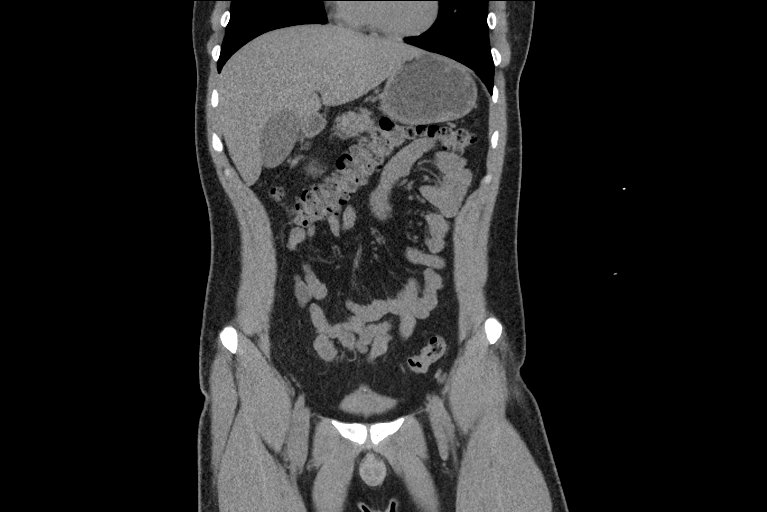
[im 50/113  soft-tissue]
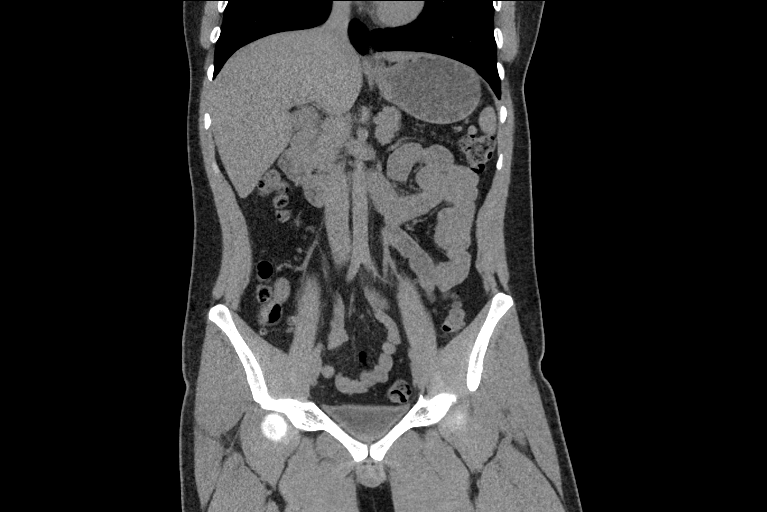
[im 63/113  soft-tissue]
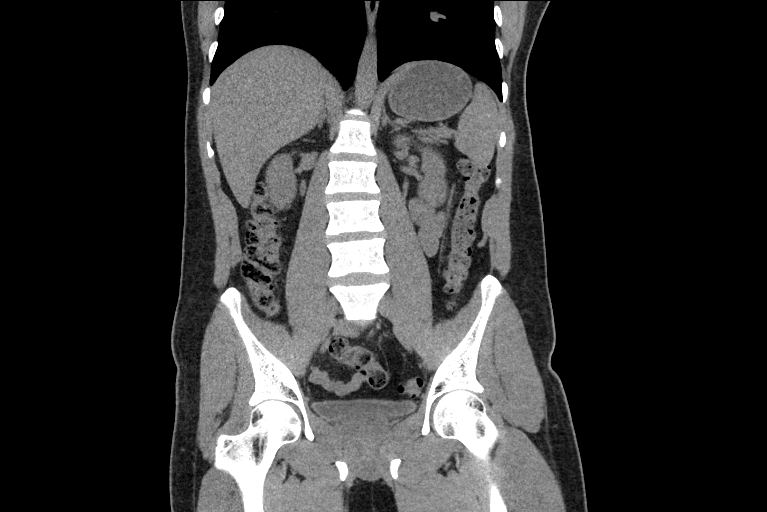

[15 of 46 positions shown; findings below may reference images not displayed]

FINDINGS: LOWER CHEST: Lung bases are clear. The visualized heart size is
normal. No pericardial effusion.

HEPATOBILIARY: Normal.

PANCREAS: Normal.

SPLEEN: Normal.

ADRENALS/URINARY TRACT: Kidneys are orthotopic, demonstrating normal
size and morphology. No nephrolithiasis, hydronephrosis; limited
assessment for renal masses on this nonenhanced examination. The
unopacified ureters are normal in course and caliber. 10 x 7 mm
calcification RIGHT lower quadrant along the course of the distal
ureter. Urinary bladder is partially distended and unremarkable.
Normal adrenal glands.

STOMACH/BOWEL: The stomach, small and large bowel are normal in
course and caliber without inflammatory changes, sensitivity
decreased by lack of enteric contrast. Normal appendix.

VASCULAR/LYMPHATIC: Aortoiliac vessels are normal in course and
caliber trace calcific atherosclerosis. No lymphadenopathy by CT
size criteria.

REPRODUCTIVE: Included scrotum demonstrates solitary LEFT testicle.
Prostate is not enlarged.

OTHER: No intraperitoneal free fluid or free air.

MUSCULOSKELETAL: Non-acute.
IMPRESSION: 1. No hydronephrosis or nephrolithiasis. 7 x 10 mm calculus in RIGHT
pelvis, suspected urolithiasis though, with typically cause
hydronephrosis.
Aortic Atherosclerosis (NRYHP-UCC.C).
